# Patient Record
Sex: Female | Born: 1950 | Race: White | Hispanic: No | Marital: Single | Smoking: Never smoker
Health system: Southern US, Community
[De-identification: ages and names within clinical notes are randomized; demographics above are authoritative.]

---

## 1998-11-25 ENCOUNTER — Other Ambulatory Visit: Admission: RE | Admit: 1998-11-25 | Discharge: 1998-11-25 | Payer: Self-pay | Admitting: Gynecology

## 1999-11-26 ENCOUNTER — Other Ambulatory Visit: Admission: RE | Admit: 1999-11-26 | Discharge: 1999-11-26 | Payer: Self-pay | Admitting: Gynecology

## 2000-03-09 ENCOUNTER — Encounter: Admission: RE | Admit: 2000-03-09 | Discharge: 2000-03-09 | Payer: Self-pay | Admitting: Gynecology

## 2000-03-09 ENCOUNTER — Encounter: Payer: Self-pay | Admitting: Gynecology

## 2001-06-06 ENCOUNTER — Encounter: Admission: RE | Admit: 2001-06-06 | Discharge: 2001-06-06 | Payer: Self-pay | Admitting: Family Medicine

## 2001-06-06 ENCOUNTER — Encounter: Payer: Self-pay | Admitting: Family Medicine

## 2003-05-23 ENCOUNTER — Encounter: Admission: RE | Admit: 2003-05-23 | Discharge: 2003-06-22 | Payer: Self-pay | Admitting: Orthopedic Surgery

## 2003-08-02 ENCOUNTER — Encounter: Admission: RE | Admit: 2003-08-02 | Discharge: 2003-08-02 | Payer: Self-pay | Admitting: Family Medicine

## 2006-06-22 ENCOUNTER — Ambulatory Visit: Payer: Self-pay | Admitting: Gastroenterology

## 2006-06-28 ENCOUNTER — Ambulatory Visit: Payer: Self-pay | Admitting: Gastroenterology

## 2006-07-05 ENCOUNTER — Ambulatory Visit: Payer: Self-pay | Admitting: Gastroenterology

## 2006-07-13 ENCOUNTER — Ambulatory Visit: Payer: Self-pay | Admitting: Gastroenterology

## 2008-05-16 ENCOUNTER — Encounter: Admission: RE | Admit: 2008-05-16 | Discharge: 2008-05-16 | Payer: Self-pay | Admitting: Obstetrics and Gynecology

## 2008-07-17 ENCOUNTER — Other Ambulatory Visit: Admission: RE | Admit: 2008-07-17 | Discharge: 2008-07-17 | Payer: Self-pay | Admitting: Obstetrics and Gynecology

## 2008-07-17 ENCOUNTER — Ambulatory Visit: Payer: Self-pay | Admitting: Obstetrics and Gynecology

## 2008-07-17 ENCOUNTER — Encounter: Payer: Self-pay | Admitting: Obstetrics and Gynecology

## 2010-12-19 NOTE — Assessment & Plan Note (Signed)
[facility]                         GASTROENTEROLOGY OFFICE NOTE   DEBBI, STRANDBERG                 MRN:          [id number]  DATE:07/13/2006                            DOB:          26-Sep-1950    Katrina Avila is doing well since her endoscopy and dilatation on June 28, 2006.  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  She continues to complain of some epigastric  pain radiating to her left upper quadrant probably from acid reflux.   She did present because of elevated alkaline phosphatase.  Gamma GG was  normal, suggesting this is not hepatic in nature.  What I did uncover is  that she is a heavy user of alcohol with c-carbide deficient transferrin  of 3.0, normal less than 2.6.  This is very specific for heavy alcohol  use showing that she drinks more than 95% of patients.  I gave her a  CAGE test today and she has evidence of alcohol abuse, but I do not  think she has alcohol dependency and is not an alcoholic.  She also had  ultrasound of her upper abdomen on July 07, 2006 which was  unremarkable, without evidence of hepatic enlargement or fatty  infiltration of her liver.  She is on Synthroid 125 mcg a day, Lexapro  10 mg a day, and Aleve for degenerative arthritis.   Exam today shows her to weigh 188 pounds, her blood pressure 122/63, her  pulse was 64 and regular.  General physical examination was not  repeated.   RECOMMENDATIONS:  1. I have advised her about a controlled drinking program with      abstinence from alcohol if at all possible.  She obviously is a      heavy abuser of alcohol.  I reviewed her lab data with her at this      time.  2. Reflux regime.  She used to be on daily PPI therapy.  3. Reflux movie concerning reflux management.  4. P.r.n. NSAIDs are okay as long she is on PPI therapy.  5. I  suggested to her that she use Caltrate 600 with vitamin D twice a      day and consider a repeat bone density scanning for Dr. Wende Bushy      since it appears that her elevated alkaline phosphatase is from a      bony source and not from her liver.     Katrina Avila. Jarold Motto, MD, Caleen Essex, FAGA  Electronically Signed    DRP/MedQ  DD: 07/13/2006  DT: 07/13/2006  Job #: [id number]   cc:   Dr. Brandt Loosen

## 2010-12-19 NOTE — Assessment & Plan Note (Signed)
[facility]                           GASTROENTEROLOGY OFFICE NOTE   KEEANNA, VILLAFRANCA                 MRN:          [id number]  DATE:06/22/2006                            DOB:          [birth year]    Katrina Avila is a 60 year old white female referred by Dr. Wende Bushy for possible  screening colonoscopy for a history of intermittent bright red blood per  rectum.   Dabria has had periodic constipation with staining and has noticed bright red  blood during these times over the last year.  She denies abdominal pain and  any upper GI complaints, except for some periodic acid reflux symptoms, and  she recently has noticed some intermittent solid food dysphagia.  She was on  Nexium, but this caused her to be more constipated.  She currently is using  p.r.n. antacids.  She has never had endoscopy or colonoscopy exams.  She  denies any history or hepatitis or known liver disease, but has recently had  some liver function tests that showed a mildly elevated alkaline  phosphatase.  It is of note that the patient uses ethanol rather heavily,  and has concerned herself about her use.  She has never had known hepatitis  or pancreatitis from alcohol use.  She follows a fairly regular diet and  denies any specific food intolerances.   PAST MEDICAL HISTORY:  1. Remarkable for arthritis in her hips and back.  2. Chronic anxiety and depression.  3. Chronic thyroid dysfunction.   She never had abdominal surgery.   FAMILY HISTORY:  Remarkable for atherosclerosis, hypertension and diabetes.  There is no known gastrointestinal colon cancer that she is aware of, but  cancer does run in her family.   SOCIAL HISTORY:  The patient is single and lives alone.  She is a Associate Professor.  She does not smoke, but uses ethanol rather heavily.  She  denies social consequences from her drinking.   MEDICATIONS:  1. Synthroid 0.125 mg a day.  2. Lexapro 10 mg 1-2 times a  week.  3. She uses Aleve twice weekly.   ALLERGIES:  She denies drug allergies.   REVIEW OF SYSTEMS:  Remarkable for frequent urination and some urinary  incontinency.  She does complain of some excessive thirst, chronic low back  pain and recurrent skin rashes.  Review of systems otherwise noncontributory  without current cardiovascular or pulmonary complaints.   PHYSICAL EXAMINATION:  VITAL SIGNS:  Height 5 feet, 1 inch tall.  Weight is  189 pounds.  Blood pressure is 136/92, pulse 80 and regular.  I could not  appreciate stigmata of chronic liver disease or thyromegaly.  CHEST:  Generally clear.  CARDIAC:  She appeared to be in a regular rhythm without significant  murmurs, gallops, or rubs.  ABDOMEN:  There was no hepatosplenomegaly, abdominal masses or tenderness.  EXTREMITIES:  Unremarkable.  MENTAL STATUS:  Clear.  RECTAL:  No eternal hemorrhoids, fistulas or fistulae.   ASSESSMENT:  1. Intermittent rectal bleeding - rule out colonic polyposis versus      internal hemorrhoids.  2. Acid reflux with solid food dysphagia and probable peptic  stricture of      the esophagus.  3. Elevated alkaline phosphatase with normal transaminases possibly      secondary to alcohol abuse.  The patient probably does have fatty      infiltration of her liver associated with her obesity.  4. History of arthritis of her hips and back.  5. History of chronic thyroid dysfunction.  6. History of chronic anxiety and depression with intermittent Lexapro      use.   RECOMMENDATIONS:  1. Outpatient endoscopy and colonoscopy at her convenience.  2. Check upper abdominal ultrasound exam.  3. Check GGT and CGT (carbohydrate deficient transferrin exams).  4. Continue other medications as per Dr. Wende Bushy.     Vania Rea. Jarold Motto, MD, Caleen Essex, FAGA  Electronically Signed    DRP/MedQ  DD: 06/22/2006  DT: 06/22/2006  Job #: [id number]   cc:   Dr. Wende Bushy

## 2011-01-01 ENCOUNTER — Encounter: Payer: Self-pay | Admitting: Obstetrics and Gynecology

## 2016-07-10 ENCOUNTER — Other Ambulatory Visit: Payer: Self-pay | Admitting: Internal Medicine

## 2016-07-10 DIAGNOSIS — N644 Mastodynia: Secondary | ICD-10-CM

## 2016-07-14 ENCOUNTER — Other Ambulatory Visit: Payer: Self-pay | Admitting: Gynecology

## 2016-07-14 DIAGNOSIS — N644 Mastodynia: Secondary | ICD-10-CM

## 2016-07-21 ENCOUNTER — Ambulatory Visit
Admission: RE | Admit: 2016-07-21 | Discharge: 2016-07-21 | Disposition: A | Discharge status: Home / Self Care | Payer: Medicare Other | Source: Ambulatory Visit | Attending: Internal Medicine | Admitting: Internal Medicine

## 2016-07-21 DIAGNOSIS — N644 Mastodynia: Secondary | ICD-10-CM

## 2018-03-08 ENCOUNTER — Other Ambulatory Visit: Payer: Self-pay | Admitting: Family Medicine

## 2018-03-08 DIAGNOSIS — R5381 Other malaise: Secondary | ICD-10-CM

## 2018-03-10 ENCOUNTER — Other Ambulatory Visit: Payer: Self-pay | Admitting: Family Medicine

## 2018-03-10 DIAGNOSIS — Z1231 Encounter for screening mammogram for malignant neoplasm of breast: Secondary | ICD-10-CM

## 2018-03-10 DIAGNOSIS — E2839 Other primary ovarian failure: Secondary | ICD-10-CM

## 2018-04-25 ENCOUNTER — Ambulatory Visit
Admission: RE | Admit: 2018-04-25 | Discharge: 2018-04-25 | Disposition: A | Discharge status: Home / Self Care | Payer: Medicare Other | Source: Ambulatory Visit | Attending: Family Medicine | Admitting: Family Medicine

## 2018-04-25 DIAGNOSIS — E2839 Other primary ovarian failure: Secondary | ICD-10-CM

## 2018-04-25 DIAGNOSIS — Z1231 Encounter for screening mammogram for malignant neoplasm of breast: Secondary | ICD-10-CM

## 2019-10-10 IMAGING — MG DIGITAL SCREENING BILATERAL MAMMOGRAM WITH TOMO AND CAD
8 series · 8 of 24 positions shown · non-contrast
Comparison: Previous exam(s).

CLINICAL DATA: Screening.

EXAM:
DIGITAL SCREENING BILATERAL MAMMOGRAM WITH TOMO AND CAD

[R CC synth-2D]
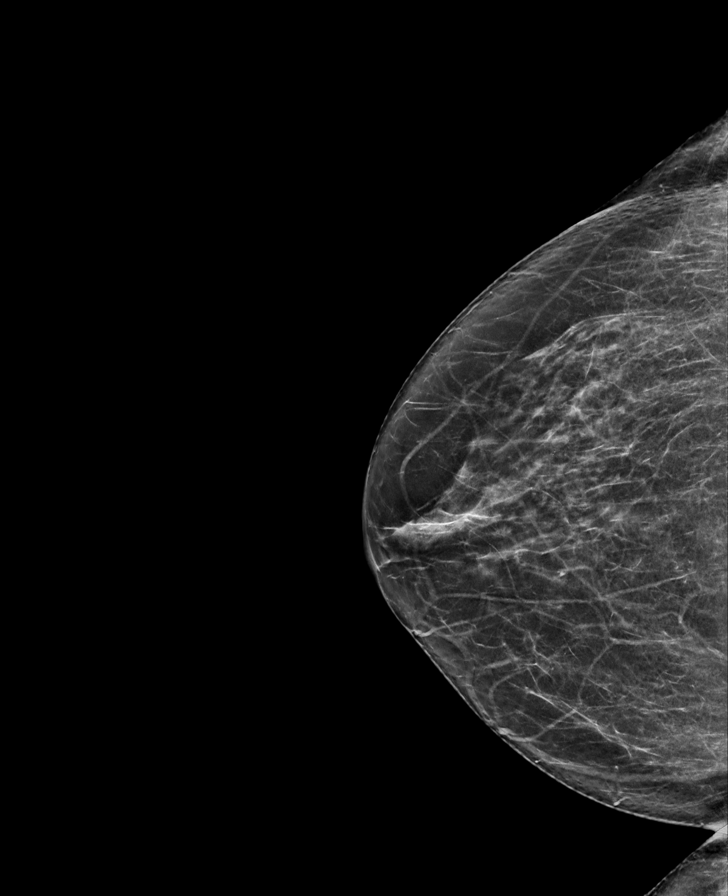

[R MLO synth-2D]
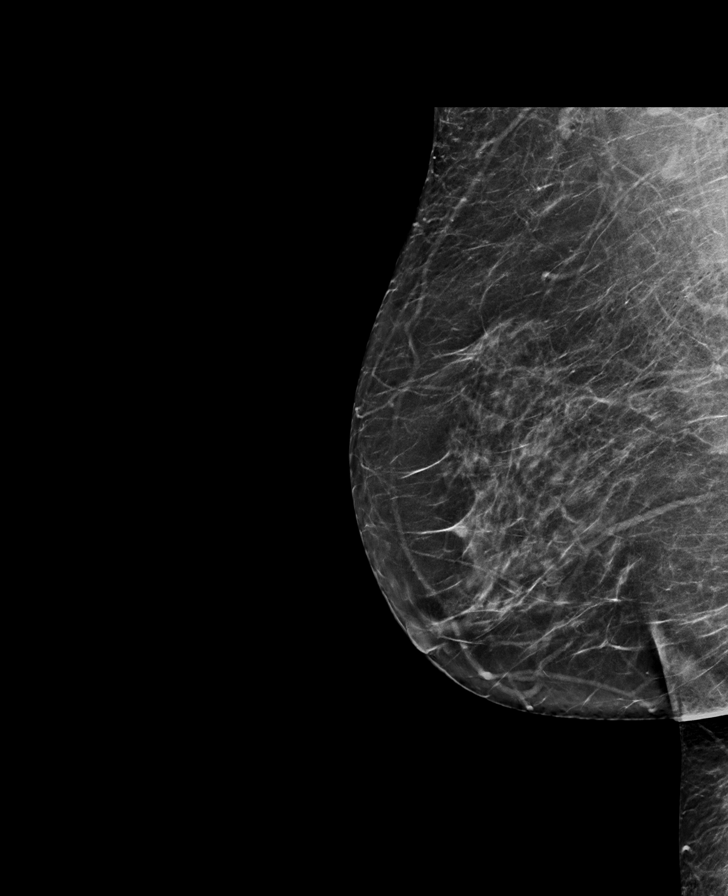

[L CC synth-2D]
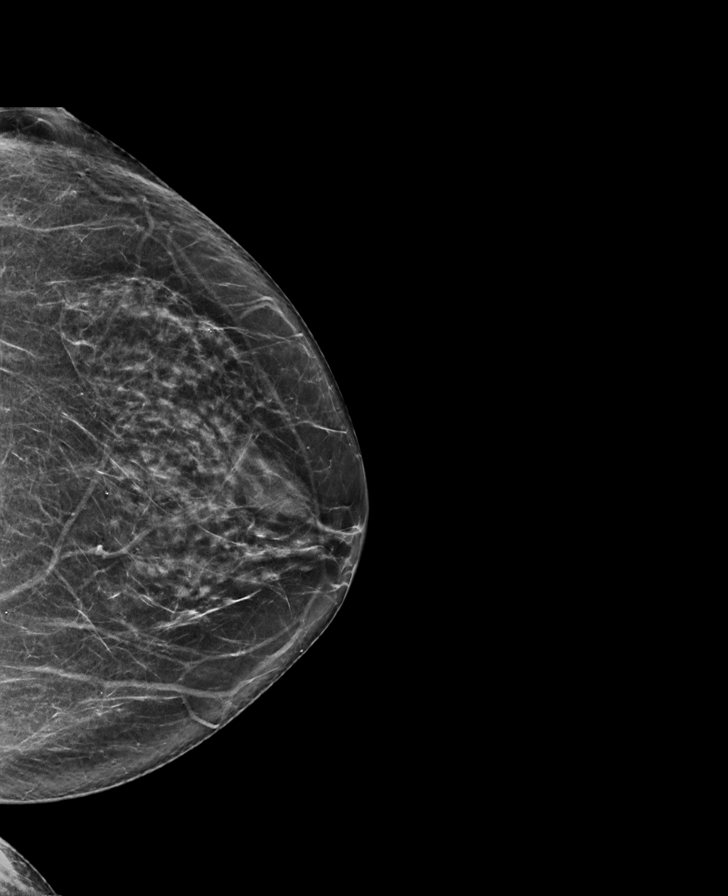

[L MLO synth-2D]
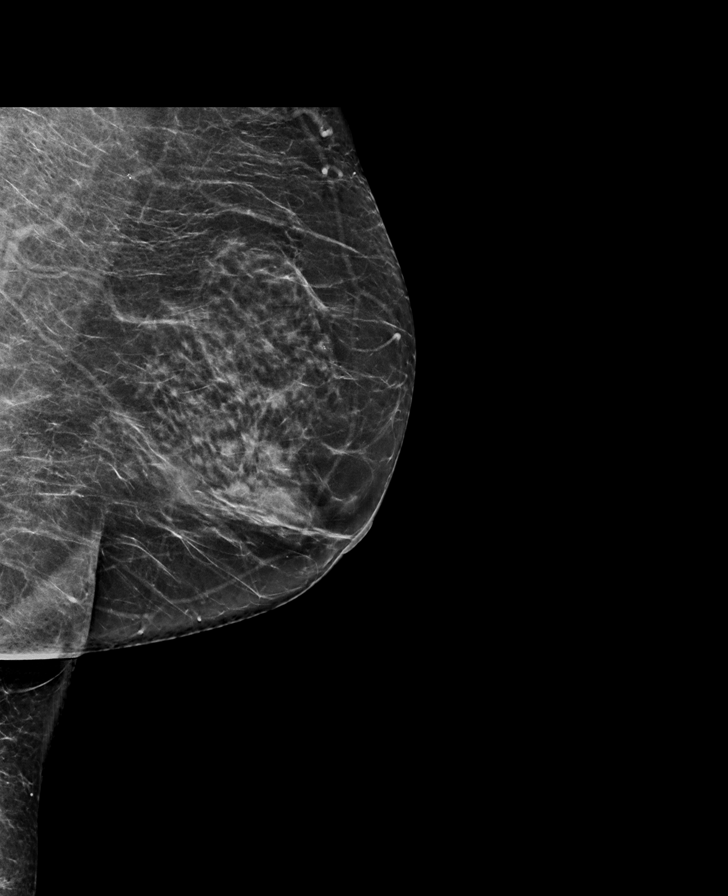

[L MLO tomo · tomo slice 37/72.0]
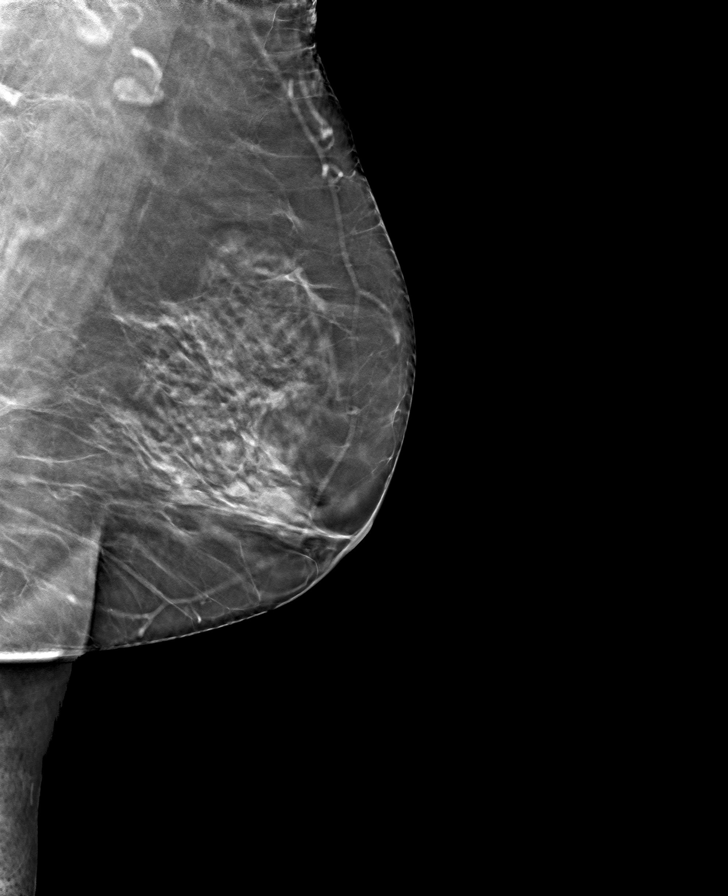

[L CC tomo · tomo slice 35/70.0]
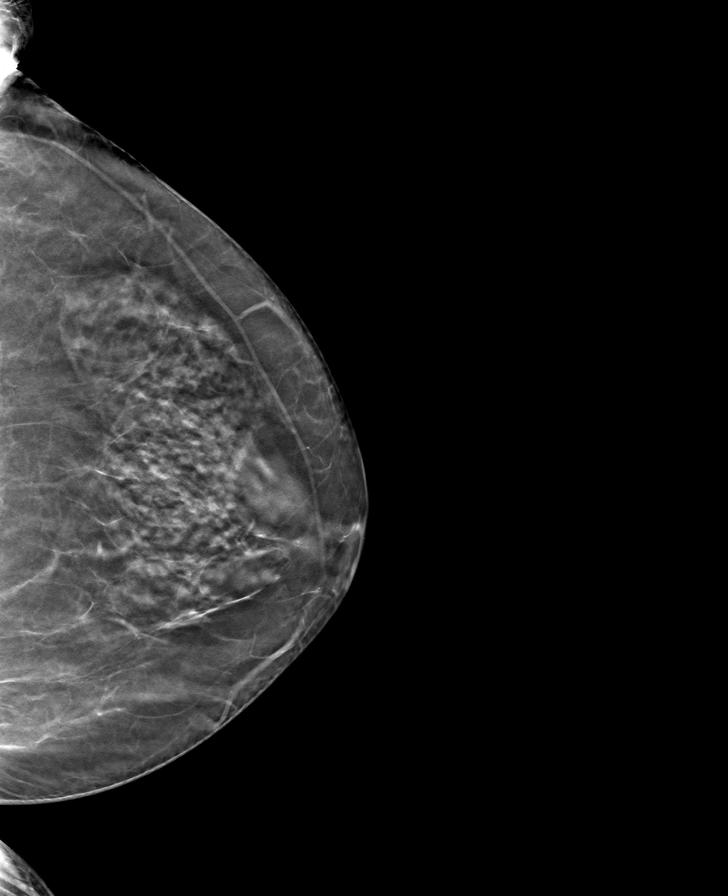

[R MLO tomo · tomo slice 37/72.0]
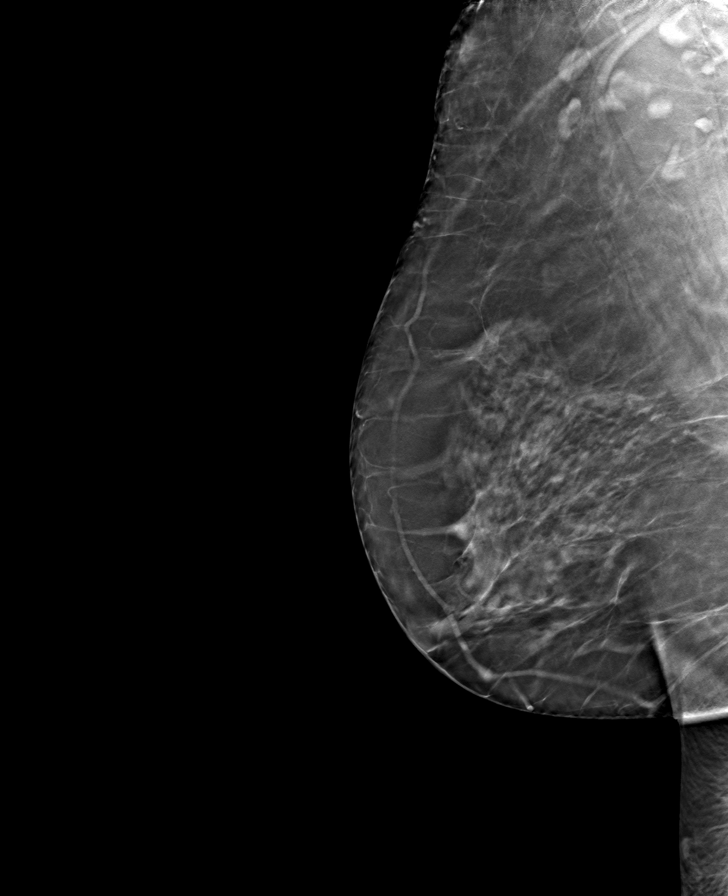

[R CC tomo · tomo slice 35/68.0]
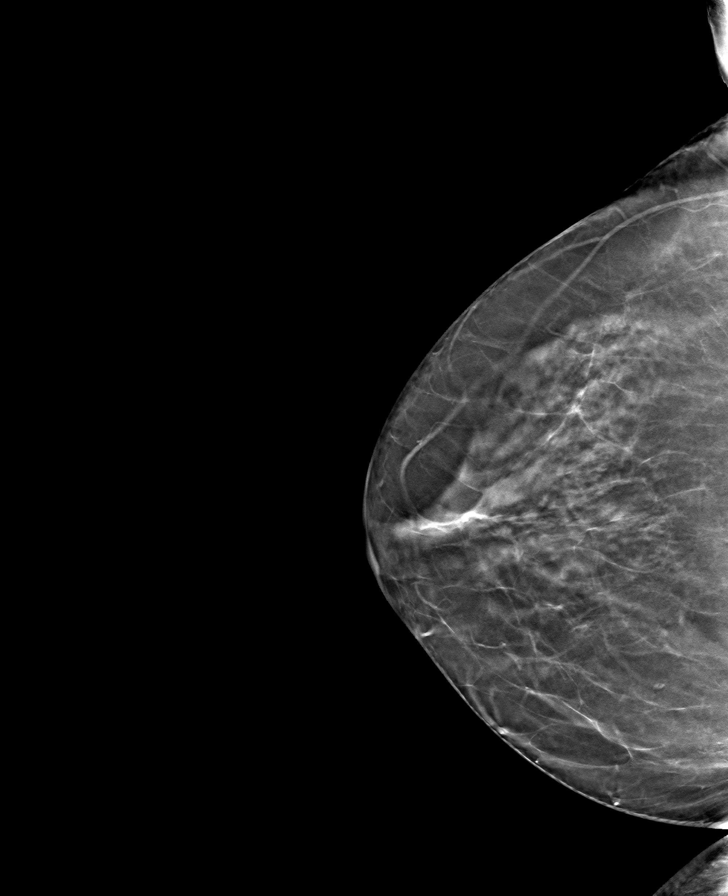

[8 of 24 positions shown; findings below may reference images not displayed]

ACR Breast Density Category b: There are scattered areas of
fibroglandular density.
FINDINGS: There are no findings suspicious for malignancy. Images were
processed with CAD.
IMPRESSION: No mammographic evidence of malignancy. A result letter of this
screening mammogram will be mailed directly to the patient.

RECOMMENDATION:
Screening mammogram in one year. (Code:CN-U-775)

BI-RADS CATEGORY  1: Negative.

## 2020-05-29 ENCOUNTER — Other Ambulatory Visit: Payer: Self-pay | Admitting: Family Medicine

## 2020-05-29 DIAGNOSIS — Z1231 Encounter for screening mammogram for malignant neoplasm of breast: Secondary | ICD-10-CM

## 2020-05-30 ENCOUNTER — Ambulatory Visit
Admission: RE | Admit: 2020-05-30 | Discharge: 2020-05-30 | Disposition: A | Discharge status: Home / Self Care | Payer: Medicare Other | Source: Ambulatory Visit | Attending: Family Medicine | Admitting: Family Medicine

## 2020-05-30 ENCOUNTER — Other Ambulatory Visit: Payer: Self-pay

## 2020-05-30 ENCOUNTER — Ambulatory Visit: Payer: Medicare Other

## 2020-05-30 DIAGNOSIS — Z1231 Encounter for screening mammogram for malignant neoplasm of breast: Secondary | ICD-10-CM

## 2022-06-12 ENCOUNTER — Encounter: Payer: Self-pay | Admitting: Internal Medicine

## 2022-06-12 ENCOUNTER — Ambulatory Visit: Payer: Medicare Other | Attending: Internal Medicine | Admitting: Internal Medicine

## 2022-06-12 VITALS — BP 132/80 | HR 61 | Ht 59.5 in | Wt 205.0 lb

## 2022-06-12 DIAGNOSIS — I517 Cardiomegaly: Secondary | ICD-10-CM

## 2022-06-12 DIAGNOSIS — E782 Mixed hyperlipidemia: Secondary | ICD-10-CM | POA: Diagnosis not present

## 2022-06-12 DIAGNOSIS — R072 Precordial pain: Secondary | ICD-10-CM | POA: Diagnosis not present

## 2022-06-12 DIAGNOSIS — R55 Syncope and collapse: Secondary | ICD-10-CM

## 2022-06-12 DIAGNOSIS — R0602 Shortness of breath: Secondary | ICD-10-CM

## 2022-06-12 NOTE — Progress Notes (Signed)
Cardiology Office Note:    Date:  06/12/2022   ID:  RIVERS GASSMANN, DOB 1951/06/06, MRN [id number]  PCP:  Gillian Scarce, MD (Inactive)   [facility] Providers Cardiologist:  Christell Constant, MD     Referring MD: Deatra James, MD   CC: SOB Consulted for the evaluation of at the behest of Dr. Sheppard Plumber  History of Present Illness:    Katrina Avila is a 71 y.o. female with a hx of fatigue and SOB.  Morbid obesity.  HLD and hypothyroidism.  In October she was having dizziness, fatigue, and tinnitus  Was last feeling well poorly all of October.  It came and went and got worse.  Got worse after his clinic.  Went to a walk in clinic because her was told on Halloween she had a heart attach in October.  Had chest pain that go to her back.  Every once in while.  Not clearly positional.  No clear exertional component.  Sharp pain.  Able to clean without issue and move furniture.  No shortness of breath.  Still feels fatigue.  Has DOE with going up stairs.  No PND or orthopnea.  No acute weight gain, leg swelling , or abdominal swelling.  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 Notes no palpitations or funny heart beats.     No prior cardiac testing  No past medical history on file.  No past surgical history on file.  Current Medications: Current Meds  Medication Sig   Cholecalciferol 50 MCG (2000 UT) CAPS Take 5,000 Units by mouth daily at 6 (six) AM.   hydrocortisone cream 1 % Apply topically as needed (roscea).   levothyroxine (SYNTHROID) 100 MCG tablet Take 100 mcg by mouth daily.   losartan-hydrochlorothiazide (HYZAAR) 50-12.5 MG tablet daily.   neomycin-polymyxin b-dexamethasone (MAXITROL) 3.5-10000-0.1 SUSP daily at 6 (six) AM.   omeprazole (PRILOSEC) 20 MG capsule Take 20 mg by mouth daily.   sertraline (ZOLOFT) 50 MG tablet Take by mouth 3 (three) times a week.      Allergies:   Ace inhibitors and Honey bee venom [bee venom]   Social History   Socioeconomic History   Marital status: Single    Spouse name: Not on file   Number of children: Not on file   Years of education: Not on file   Highest education level: Not on file  Occupational History   Not on file  Tobacco Use   Smoking status: Never   Smokeless tobacco: Never  Substance and Sexual Activity   Alcohol use: Yes    Alcohol/week: 4.0 standard drinks of alcohol    Types: 4 Glasses of wine per week    Comment: weekly   Drug use: Never   Sexual activity: Not Currently  Other Topics Concern   Not on file  Social History Narrative   Not on file   Social Determinants of Health   Financial Resource Strain: Not on file  Food Insecurity: Not on file  Transportation Needs: Not on file  Physical Activity: Not on file  Stress: Not on file  Social Connections: Not on file    Family History: The patient's family history includes Breast cancer in her cousin, cousin, maternal aunt, and maternal aunt. Father had MI Mother had CHF at 27  ROS:   Please see the history of present illness.     All other systems reviewed and  are negative.  EKGs/Labs/Other Studies Reviewed:    The following studies were reviewed today:  EKG:  EKG is  ordered today.  The ekg ordered today demonstrates  SR 61 LVH and Bi-atrial enlargement small septal Qs 06/03/31 SR LVH Bi-atrial enlargement with small septal Qs  Recent Labs: No results found for requested labs within last 365 days.  Recent Lipid Panel No results found for: "CHOL", "TRIG", "HDL", "CHOLHDL", "VLDL", "LDLCALC", "LDLDIRECT"   Physical Exam:    VS:  BP 132/80   Pulse 61   Ht 4' 11.5" (1.511 m)   Wt 205 lb (93 kg)   SpO2 97%   BMI 40.71 kg/m     Wt Readings from Last 3 Encounters:  06/12/22 205 lb (93 kg)    GEN: no distress morbid obesity HEENT: Normal NECK: No JVD LYMPHATICS: No lymphadenopathy CARDIAC: RRR, systolic  murmur, no rubs, gallops RESPIRATORY:  Clear to auscultation without rales, wheezing or rhonchi  ABDOMEN: Soft, non-tender, non-distended MUSCULOSKELETAL:  No edema; No deformity  SKIN: Warm and dry NEUROLOGIC:  Alert and oriented x 3 PSYCHIATRIC:  Normal affect   ASSESSMENT:    1. Precordial pain   2. SOB (shortness of breath)   3. LVH (left ventricular hypertrophy)   4. Mixed hyperlipidemia   5. Morbid obesity (HCC)   6. Vasovagal syncope    PLAN:    Chest pain - will clarify her diagnosis of prior MI, PET MPI; she cannot exercise on treadmill  LVH Systolic murmur Biatrial enlargement - suspect AS; will get echo  HLD Morbid obesity - Discuss lifestyle changes  Hx of vasovagal syncope - no sx, monitor   Medication Adjustments/Labs and Tests Ordered: Current medicines are reviewed at length with the patient today.  Concerns regarding medicines are outlined above.  Orders Placed This Encounter  Procedures   NM PET CT CARDIAC PERFUSION MULTI W/ABSOLUTE BLOODFLOW   EKG 12-Lead   ECHOCARDIOGRAM COMPLETE   No orders of the defined types were placed in this encounter.   Patient Instructions  Medication Instructions:  Your physician recommends that you continue on your current medications as directed. Please refer to the Current Medication list given to you today.  *If you need a refill on your cardiac medications before your next appointment, please call your pharmacy*   Lab Work: NONE If you have labs (blood work) drawn today and your tests are completely normal, you will receive your results only by: MyChart Message (if you have MyChart) OR A paper copy in the mail If you have any lab test that is abnormal or we need to change your treatment, we will call you to review the results.   Testing/Procedures: Your physician has requested that you have an echocardiogram. Echocardiography is a painless test that uses sound waves to create images of your heart. It  provides your doctor with information about the size and shape of your heart and how well your heart's chambers and valves are working. This procedure takes approximately one hour. There are no restrictions for this procedure. Please do NOT wear cologne, perfume, aftershave, or lotions (deodorant is allowed). Please arrive 15 minutes prior to your appointment time.    Follow-Up: At [facility], you and your health needs are our priority.  As part of our continuing mission to provide you with exceptional heart care, we have created designated Provider Care Teams.  These Care Teams include your primary Cardiologist (physician) and Advanced Practice Providers (APPs -  Physician Assistants and Nurse  Practitioners) who all work together to provide you with the care you need, when you need it.  We recommend signing up for the patient portal called "MyChart".  Sign up information is provided on this After Visit Summary.  MyChart is used to connect with patients for Virtual Visits (Telemedicine).  Patients are able to view lab/test results, encounter notes, upcoming appointments, etc.  Non-urgent messages can be sent to your provider as well.   To learn more about what you can do with MyChart, go to [email or web address].    Your next appointment:   3-4  month(s)  The format for your next appointment:   In Person  Provider:  Riley Lam, MD     Other Instructions How to Prepare for Your Cardiac PET/CT Stress Test:  1. Please do not take these medications before your test:   Medications that may interfere with the cardiac pharmacological stress agent (ex. nitrates - including erectile dysfunction medications or beta-blockers) the day of the exam. (Erectile dysfunction medication should be held for at least 72 hrs prior to test) Theophylline containing medications for 12 hours. Dipyridamole 48 hours prior to the test. Your remaining medications may be taken with water.  2.  Nothing to eat or drink, except water, 3 hours prior to arrival time.   NO caffeine/decaffeinated products, or chocolate 12 hours prior to arrival.  3. NO perfume, cologne or lotion  4. Total time is 1 to 2 hours; you may want to bring reading material for the waiting time.  5. Please report to Admitting at the [facility] Main Entrance 60 minutes early for your test.  [address] [place], [place] [address]  Diabetic Preparation:  Hold oral medications. You may take NPH and Lantus insulin. Do not take Humalog or Humulin R (Regular Insulin) the day of your test. Check blood sugars prior to leaving the house. If able to eat breakfast prior to 3 hour fasting, you may take all medications, including your insulin, Do not worry if you miss your breakfast dose of insulin - start at your next meal.   In preparation for your appointment, medication and supplies will be purchased.  Appointment availability is limited, so if you need to cancel or reschedule, please call the Radiology Department at [phone]  24 hours in advance to avoid a cancellation fee of $100.00  What to Expect After you Arrive:  Once you arrive and check in for your appointment, you will be taken to a preparation room within the Radiology Department.  A technologist or Nurse will obtain your medical history, verify that you are correctly prepped for the exam, and explain the procedure.  Afterwards,  an IV will be started in your arm and electrodes will be placed on your skin for EKG monitoring during the stress portion of the exam. Then you will be escorted to the PET/CT scanner.  There, staff will get you positioned on the scanner and obtain a blood pressure and EKG.  During the exam, you will continue to be connected to the EKG and blood pressure machines.  A small, safe amount of a radioactive tracer will be injected in your IV to obtain a series of pictures of your heart along with an injection of a stress  agent.    After your Exam:  It is recommended that you eat a meal and drink a caffeinated beverage to counter act any effects of the stress agent.  Drink plenty of fluids for the remainder of the  day and urinate frequently for the first couple of hours after the exam.  Your doctor will inform you of your test results within 7-10 business days.  For questions about your test or how to prepare for your test, please call: Rockwell Alexandria, Cardiac Imaging Nurse Navigator  Larey Brick, Cardiac Imaging Nurse Navigator Office: [phone]   Important Information About Sugar         Signed, Christell Constant, MD  06/12/2022 5:43 PM    [facility]

## 2022-06-12 NOTE — Patient Instructions (Addendum)
Medication Instructions:  Your physician recommends that you continue on your current medications as directed. Please refer to the Current Medication list given to you today.  *If you need a refill on your cardiac medications before your next appointment, please call your pharmacy*   Lab Work: NONE If you have labs (blood work) drawn today and your tests are completely normal, you will receive your results only by: MyChart Message (if you have MyChart) OR A paper copy in the mail If you have any lab test that is abnormal or we need to change your treatment, we will call you to review the results.   Testing/Procedures: Your physician has requested that you have an echocardiogram. Echocardiography is a painless test that uses sound waves to create images of your heart. It provides your doctor with information about the size and shape of your heart and how well your heart's chambers and valves are working. This procedure takes approximately one hour. There are no restrictions for this procedure. Please do NOT wear cologne, perfume, aftershave, or lotions (deodorant is allowed). Please arrive 15 minutes prior to your appointment time.    Follow-Up: At [facility], you and your health needs are our priority.  As part of our continuing mission to provide you with exceptional heart care, we have created designated Provider Care Teams.  These Care Teams include your primary Cardiologist (physician) and Advanced Practice Providers (APPs -  Physician Assistants and Nurse Practitioners) who all work together to provide you with the care you need, when you need it.  We recommend signing up for the patient portal called "MyChart".  Sign up information is provided on this After Visit Summary.  MyChart is used to connect with patients for Virtual Visits (Telemedicine).  Patients are able to view lab/test results, encounter notes, upcoming appointments, etc.  Non-urgent messages can be sent to your  provider as well.   To learn more about what you can do with MyChart, go to [email or web address].    Your next appointment:   3-4  month(s)  The format for your next appointment:   In Person  Provider:  Riley Lam, MD     Other Instructions How to Prepare for Your Cardiac PET/CT Stress Test:  1. Please do not take these medications before your test:   Medications that may interfere with the cardiac pharmacological stress agent (ex. nitrates - including erectile dysfunction medications or beta-blockers) the day of the exam. (Erectile dysfunction medication should be held for at least 72 hrs prior to test) Theophylline containing medications for 12 hours. Dipyridamole 48 hours prior to the test. Your remaining medications may be taken with water.  2. Nothing to eat or drink, except water, 3 hours prior to arrival time.   NO caffeine/decaffeinated products, or chocolate 12 hours prior to arrival.  3. NO perfume, cologne or lotion  4. Total time is 1 to 2 hours; you may want to bring reading material for the waiting time.  5. Please report to Admitting at the [facility] Main Entrance 60 minutes early for your test.  [address] [place], [place] [address]  Diabetic Preparation:  Hold oral medications. You may take NPH and Lantus insulin. Do not take Humalog or Humulin R (Regular Insulin) the day of your test. Check blood sugars prior to leaving the house. If able to eat breakfast prior to 3 hour fasting, you may take all medications, including your insulin, Do not worry if you miss your breakfast dose  of insulin - start at your next meal.   In preparation for your appointment, medication and supplies will be purchased.  Appointment availability is limited, so if you need to cancel or reschedule, please call the Radiology Department at [phone]  24 hours in advance to avoid a cancellation fee of $100.00  What to Expect After you Arrive:  Once  you arrive and check in for your appointment, you will be taken to a preparation room within the Radiology Department.  A technologist or Nurse will obtain your medical history, verify that you are correctly prepped for the exam, and explain the procedure.  Afterwards,  an IV will be started in your arm and electrodes will be placed on your skin for EKG monitoring during the stress portion of the exam. Then you will be escorted to the PET/CT scanner.  There, staff will get you positioned on the scanner and obtain a blood pressure and EKG.  During the exam, you will continue to be connected to the EKG and blood pressure machines.  A small, safe amount of a radioactive tracer will be injected in your IV to obtain a series of pictures of your heart along with an injection of a stress agent.    After your Exam:  It is recommended that you eat a meal and drink a caffeinated beverage to counter act any effects of the stress agent.  Drink plenty of fluids for the remainder of the day and urinate frequently for the first couple of hours after the exam.  Your doctor will inform you of your test results within 7-10 business days.  For questions about your test or how to prepare for your test, please call: Rockwell Alexandria, Cardiac Imaging Nurse Navigator  Larey Brick, Cardiac Imaging Nurse Navigator Office: [phone]   Important Information About Sugar

## 2022-07-13 ENCOUNTER — Ambulatory Visit (HOSPITAL_COMMUNITY): Payer: Medicare Other | Attending: Cardiovascular Disease

## 2022-07-13 DIAGNOSIS — R0602 Shortness of breath: Secondary | ICD-10-CM | POA: Insufficient documentation

## 2022-07-13 DIAGNOSIS — R072 Precordial pain: Secondary | ICD-10-CM | POA: Diagnosis not present

## 2022-07-13 DIAGNOSIS — I080 Rheumatic disorders of both mitral and aortic valves: Secondary | ICD-10-CM | POA: Diagnosis not present

## 2022-07-13 DIAGNOSIS — E785 Hyperlipidemia, unspecified: Secondary | ICD-10-CM | POA: Diagnosis not present

## 2022-07-13 LAB — ECHOCARDIOGRAM COMPLETE
AR max vel: 1.45 cm2
AV Area VTI: 1.53 cm2
AV Area mean vel: 1.54 cm2
AV Mean grad: 9.5 mmHg
AV Peak grad: 19.3 mmHg
Ao pk vel: 2.2 m/s
Area-P 1/2: 3.19 cm2
P 1/2 time: 462 msec
S' Lateral: 2.3 cm

## 2022-07-17 ENCOUNTER — Telehealth: Payer: Self-pay | Admitting: Internal Medicine

## 2022-07-17 NOTE — Telephone Encounter (Signed)
Patient is calling to get her echo results. Please call back

## 2022-07-17 NOTE — Telephone Encounter (Signed)
Patient was asking for her Echo results. I let her know they weren't available yet.

## 2022-07-21 NOTE — Telephone Encounter (Signed)
Talked with pt in regards to having PET Scan performed.  Pt reports was told had a prior Heart Attack and is unsure if this is correct.  Advised pt that per MD OV note has a hx of prior MI.  Advised pt that having PET Scan will give more information as to if actually had a prior MI.    Pt will have PET Scan on 07/28/22 and will call back or send in a my chart message if decides would like to speak with MD about dx of MI.

## 2022-07-21 NOTE — Telephone Encounter (Signed)
Left a message to call back.

## 2022-07-24 ENCOUNTER — Telehealth (HOSPITAL_COMMUNITY): Payer: Self-pay | Admitting: Emergency Medicine

## 2022-07-24 NOTE — Telephone Encounter (Signed)
Reaching out to patient to offer assistance regarding upcoming cardiac imaging study; pt verbalizes understanding of appt date/time, parking situation and where to check in, pre-test NPO status and medications ordered, and verified current allergies; name and call back number provided for further questions should they arise Rockwell Alexandria RN Navigator Cardiac Imaging [facility] [phone] office [phone] cell  Arrival 1200 Daily meds  No food No caffeine

## 2022-07-28 ENCOUNTER — Ambulatory Visit (HOSPITAL_COMMUNITY)
Admission: RE | Admit: 2022-07-28 | Discharge: 2022-07-28 | Disposition: A | Discharge status: Home / Self Care | Payer: Medicare Other | Source: Ambulatory Visit | Attending: Internal Medicine | Admitting: Internal Medicine

## 2022-07-28 DIAGNOSIS — R072 Precordial pain: Secondary | ICD-10-CM | POA: Insufficient documentation

## 2022-07-28 LAB — NM PET CT CARDIAC PERFUSION MULTI W/ABSOLUTE BLOODFLOW
Base ST Depression (mm): 0 mm
MBFR: 2.07
Nuc Rest EF: 70 %
Nuc Stress EF: 76 %
Rest MBF: 1.5 ml/g/min
ST Depression (mm): 0 mm
Stress MBF: 3.11 ml/g/min
TID: 1

## 2022-07-28 MED ORDER — RUBIDIUM RB82 GENERATOR (RUBYFILL)
24.0000 | PACK | Freq: Once | INTRAVENOUS | Status: AC
  Administered 2022-07-28: 24 via INTRAVENOUS

## 2022-07-28 MED ORDER — REGADENOSON 0.4 MG/5ML IV SOLN
0.4000 mg | Freq: Once | INTRAVENOUS | Status: AC
  Administered 2022-07-28: 0.4 mg via INTRAVENOUS

## 2022-07-31 ENCOUNTER — Encounter: Payer: Self-pay | Admitting: Internal Medicine

## 2022-07-31 NOTE — Telephone Encounter (Signed)
Called pt in regards to PET Scan results.  Pt is concerned about the following finding:  Coronary calcium assessment not performed due to evidence of metallic appearing structure in the LCx territory most likely consistent with prior PCI.  Pt reports came to Cardiologist to determine if had a prior MI d/t an abnormal EKG that show previous MI.  Wants to know if she has had an MI.    Asked if has ever had a Heart Cath or went to the hospital with CP.  Pt denies both.  Does not know how a stent could be in her heart has never had any type of work done on heart.    Pt is also concerned about lung findings.  Advised pt per MD- PET Scan did not show anything of concern.    Will route to MD to follow up.

## 2022-09-30 ENCOUNTER — Encounter: Payer: Self-pay | Admitting: Internal Medicine

## 2022-09-30 ENCOUNTER — Ambulatory Visit: Payer: Medicare Other | Attending: Internal Medicine | Admitting: Internal Medicine

## 2022-09-30 VITALS — BP 130/80 | HR 65 | Ht 60.0 in | Wt 210.0 lb

## 2022-09-30 DIAGNOSIS — E782 Mixed hyperlipidemia: Secondary | ICD-10-CM | POA: Diagnosis present

## 2022-09-30 DIAGNOSIS — I3481 Nonrheumatic mitral (valve) annulus calcification: Secondary | ICD-10-CM | POA: Diagnosis present

## 2022-09-30 NOTE — Progress Notes (Signed)
Cardiology Office Note:    Date:  09/30/2022   ID:  Katrina Avila, DOB Feb 19, 1951, MRN [id number]  PCP:  Jonathon Resides, MD (Inactive)   [facility] Providers Cardiologist:  Werner Lean, MD     Referring MD: No ref. provider found   CC: SOB  History of Present Illness:    Katrina Avila is a 72 y.o. female with a hx of fatigue and SOB.  Morbid obesity.  HLD and hypothyroidism. 2023: Negative PET but with Mitral annular Calcification.  No dysfunction on the echo.  Patient notes that she is doing well.   Since last visit notes no more chest pain. She is working on the the election (night shift of primary)  No chest pain or pressure .  No SOB/DOE and no PND/Orthopnea.  No weight gain or leg swelling.  No palpitations or syncope.  Has have fatigue with all of this.  No past medical history on file.  No past surgical history on file.  Current Medications: Current Meds  Medication Sig   Cholecalciferol 50 MCG (2000 UT) CAPS Take 5,000 Units by mouth daily at 6 (six) AM.   hydrocortisone cream 1 % Apply topically as needed (roscea).   levothyroxine (SYNTHROID) 100 MCG tablet Take 100 mcg by mouth daily.   losartan-hydrochlorothiazide (HYZAAR) 50-12.5 MG tablet daily.   neomycin-polymyxin b-dexamethasone (MAXITROL) 3.5-10000-0.1 SUSP daily at 6 (six) AM.   omeprazole (PRILOSEC) 20 MG capsule Take 20 mg by mouth daily.   prednisoLONE acetate (PRED FORTE) 1 % ophthalmic suspension Place into both eyes as directed.   sertraline (ZOLOFT) 50 MG tablet Take by mouth 3 (three) times a week.     Allergies:   Ace inhibitors and Honey bee venom [bee venom]   Social History   Socioeconomic History   Marital status: Single    Spouse name: Not on file   Number of children: Not on file   Years of education: Not on file   Highest education level: Not on file  Occupational History   Not on file  Tobacco Use   Smoking status: Never   Smokeless  tobacco: Never  Substance and Sexual Activity   Alcohol use: Yes    Alcohol/week: 4.0 standard drinks of alcohol    Types: 4 Glasses of wine per week    Comment: weekly   Drug use: Never   Sexual activity: Not Currently  Other Topics Concern   Not on file  Social History Narrative   Not on file   Social Determinants of Health   Financial Resource Strain: Not on file  Food Insecurity: Not on file  Transportation Needs: Not on file  Physical Activity: Not on file  Stress: Not on file  Social Connections: Not on file    Family History: The patient's family history includes Breast cancer in her cousin, cousin, maternal aunt, and maternal aunt. Father had MI Mother had CHF at 62  ROS:   Please see the history of present illness.     All other systems reviewed and are negative.  EKGs/Labs/Other Studies Reviewed:    The following studies were reviewed today:  EKG:   SR 61 LVH and Bi-atrial enlargement small septal Qs 06/03/31 SR LVH Bi-atrial enlargement with small septal Qs  Cardiac Studies & Procedures     STRESS TESTS  NM PET CT CARDIAC PERFUSION MULTI W/ABSOLUTE BLOODFLOW 07/28/2022  Narrative   The study is normal. The study is low risk.   LV perfusion  is normal.   Rest left ventricular function is normal. Rest EF: 70 %. Stress left ventricular function is normal. Stress EF: 76 %. End diastolic cavity size is normal. End systolic cavity size is normal.   Myocardial blood flow was computed to be 1.49m/g/min at rest and 3.166mg/min at stress. Global myocardial blood flow reserve was 2.07 and was normal.   Coronary calcium assessment not performed due to evidence of metallic appearing structure in the LCx territory most likely consistent with prior PCI.   Electronically signed: HeGwyndolyn KaufmanMD  EXAM: OVER-READ INTERPRETATION CARDIAC CT CHEST  The following report is an over-read performed by radiologist Dr. WaVan Clinesf [facility] [place]  07/28/2022. This over-read does not include interpretation of cardiac or coronary anatomy or pathology. The cardiac PET interpretation by the cardiologist is attached.  COMPARISON:  None.  FINDINGS: Extracardiac Vascular: Descending thoracic aortic atherosclerotic vascular calcification.  Mediastinum: Unremarkable  Lung: Mild dependent atelectasis in the lung bases, equivocal peripheral reticulonodular interstitial accentuation. No acute findings. The  Included Upper Abdomen: Unremarkable  Musculoskeletal: Mild rightward tilt of the sternal body.  IMPRESSION: 1. Mild rightward tilt of the sternal body. 2. Mild dependent atelectasis in the lung bases, with equivocal peripheral reticulonodular interstitial accentuation. This is nonspecific but can be seen in the setting of low-grade interstitial lung disease. 3. Descending thoracic aortic atherosclerotic vascular calcification.  Aortic Atherosclerosis (ICD10-I70.0).   Electronically Signed By: WaVan Clines.D. On: 07/28/2022 16:08   ECHOCARDIOGRAM  ECHOCARDIOGRAM COMPLETE 07/13/2022  Narrative ECHOCARDIOGRAM REPORT    [facility]:   [facility] 07/13/2022 Medical Rec #:  [id number]          Height:       59.5 in Accession #:    [id number]         Weight:       205.0 lb Date of Birth:  12Jun 28, 1952         BSA:          1.875 m Patient Age:    [age]             BP:           132/80 mmHg Patient Gender: F                    HR:           73 bpm. [place] [facility]  [facility] [facility]D Echo, Cardiac Doppler and Color Doppler  Indications:    R06.02 SOB  History:        Patient has no prior history of Echocardiogram examinations. Morbid obesity, Arrythmias:LVH, Signs/Symptoms:Shortness of Breath and Murmur; Risk Factors:Dyslipidemia.  Sonographer:    WiCoralyn HellingDCS Referring Phys: [id number] Kojo Liby  IMPRESSIONS   1. Left ventricular ejection  fraction, by estimation, is 60 to 65%. The left ventricle has normal function. The left ventricle has no regional wall motion abnormalities. Left ventricular diastolic parameters were normal. 2. Right ventricular systolic function is normal. The right ventricular size is normal. 3. The mitral valve is abnormal. Mild mitral valve regurgitation. No evidence of mitral stenosis. Moderate mitral annular calcification. 4. The aortic valve is tricuspid. There is moderate calcification of the aortic valve. There is moderate thickening of the aortic valve. Aortic valve regurgitation is not visualized. Mild aortic valve stenosis. 5. The inferior vena cava is normal in size with greater than 50% respiratory variability,  suggesting right atrial pressure of 3 mmHg.  FINDINGS Left Ventricle: Left ventricular ejection fraction, by estimation, is 60 to 65%. The left ventricle has normal function. The left ventricle has no regional wall motion abnormalities. The left ventricular internal cavity size was normal in size. There is no left ventricular hypertrophy. Left ventricular diastolic parameters were normal.  Right Ventricle: The right ventricular size is normal. No increase in right ventricular wall thickness. Right ventricular systolic function is normal.  Left Atrium: Left atrial size was normal in size.  Right Atrium: Right atrial size was normal in size.  Pericardium: There is no evidence of pericardial effusion.  Mitral Valve: The mitral valve is abnormal. There is mild thickening of the mitral valve leaflet(s). There is mild calcification of the mitral valve leaflet(s). Moderate mitral annular calcification. Mild mitral valve regurgitation. No evidence of mitral valve stenosis.  Tricuspid Valve: The tricuspid valve is normal in structure. Tricuspid valve regurgitation is mild . No evidence of tricuspid stenosis.  Aortic Valve: The aortic valve is tricuspid. There is moderate calcification of the  aortic valve. There is moderate thickening of the aortic valve. Aortic valve regurgitation is not visualized. Aortic regurgitation PHT measures 462 msec. Mild aortic stenosis is present. Aortic valve mean gradient measures 9.5 mmHg. Aortic valve peak gradient measures 19.3 mmHg. Aortic valve area, by VTI measures 1.53 cm.  Pulmonic Valve: The pulmonic valve was normal in structure. Pulmonic valve regurgitation is not visualized. No evidence of pulmonic stenosis.  Aorta: The aortic root is normal in size and structure.  Venous: The inferior vena cava is normal in size with greater than 50% respiratory variability, suggesting right atrial pressure of 3 mmHg.  IAS/Shunts: No atrial level shunt detected by color flow Doppler.   LEFT VENTRICLE PLAX 2D LVIDd:         4.30 cm   Diastology LVIDs:         2.30 cm   LV e' medial:    9.25 cm/s LV PW:         1.20 cm   LV E/e' medial:  15.1 LV IVS:        0.90 cm   LV e' lateral:   11.20 cm/s LVOT diam:     1.70 cm   LV E/e' lateral: 12.5 LV SV:         81 LV SV Index:   43 LVOT Area:     2.27 cm   RIGHT VENTRICLE             IVC RV S prime:     14.10 cm/s  IVC diam: 1.00 cm TAPSE (M-mode): 2.2 cm RVSP:           34.6 mmHg  LEFT ATRIUM             Index        RIGHT ATRIUM           Index LA diam:        3.70 cm 1.97 cm/m   RA Pressure: 3.00 mmHg LA Vol (A2C):   39.6 ml 21.12 ml/m  RA Area:     11.20 cm LA Vol (A4C):   50.4 ml 26.89 ml/m  RA Volume:   27.00 ml  14.40 ml/m LA Biplane Vol: 48.0 ml 25.61 ml/m AORTIC VALVE AV Area (Vmax):    1.45 cm AV Area (Vmean):   1.54 cm AV Area (VTI):     1.53 cm AV Vmax:  219.50 cm/s AV Vmean:          146.500 cm/s AV VTI:            0.530 m AV Peak Grad:      19.3 mmHg AV Mean Grad:      9.5 mmHg LVOT Vmax:         140.00 cm/s LVOT Vmean:        99.300 cm/s LVOT VTI:          0.358 m LVOT/AV VTI ratio: 0.68 AI PHT:            462 msec  AORTA Ao Root diam: 3.30 cm Ao Asc  diam:  3.60 cm  MITRAL VALVE                TRICUSPID VALVE MV Area (PHT): 3.19 cm     TR Peak grad:   31.6 mmHg MV Decel Time: 238 msec     TR Vmax:        281.00 cm/s MV E velocity: 140.00 cm/s  Estimated RAP:  3.00 mmHg MV A velocity: 166.00 cm/s  RVSP:           34.6 mmHg MV E/A ratio:  0.84 SHUNTS Systemic VTI:  0.36 m Systemic Diam: 1.70 cm  Jenkins Rouge MD Electronically signed by Jenkins Rouge MD Signature Date/Time: 07/13/2022/2:11:29 PM    Final              Recent Labs: No results found for requested labs within last 365 days.  Recent Lipid Panel No results found for: "CHOL", "TRIG", "HDL", "CHOLHDL", "VLDL", "LDLCALC", "LDLDIRECT"   Physical Exam:    VS:  BP 130/80   Pulse 65   Ht 5' (1.524 m)   Wt 210 lb (95.3 kg)   SpO2 96%   BMI 41.01 kg/m     Wt Readings from Last 3 Encounters:  09/30/22 210 lb (95.3 kg)  06/12/22 205 lb (93 kg)    GEN: No distress morbid obesity HEENT: Normal NECK: No JVD CARDIAC: RRR, systolic murmur, no rubs, gallops RESPIRATORY:  Clear to auscultation without rales, wheezing or rhonchi  ABDOMEN: Soft, non-tender, non-distended MUSCULOSKELETAL:  No edema; No deformity  SKIN: Warm and dry NEUROLOGIC:  Alert and oriented x 3 PSYCHIATRIC:  Normal affect   ASSESSMENT:    1. Mixed hyperlipidemia   2. Morbid obesity ([place])   3. Mitral annular calcification     PLAN:    HLD Morbid obesity - she is not amenable to starting medication at this time - we have discussed a return to walking program at the [facility] - we created a sample meal plan using the Mediterranean diet, 1800 calorie goal, and her dislike of chickpeas; it has been sent to her via MyChart - lipids and Lp(a) in 6 months  Systolic murmur - has trivial aortic sclerosis and MAC  - reviewed PET-CT and test results with patient  LVH Hypertension - Controlled on current therapy  Hx of vasovagal syncope - no sx, monitor   Time Spent Directly with Patient:    I have spent a total of 40 minutes with the patient reviewing notes, imaging, EKGs, labs and examining the patient as well as establishing an assessment and plan that was discussed personally with the patient.  > 50% of time was spent in direct patient care andand reviewing imaging with patient.   One year with me    Medication Adjustments/Labs and Tests Ordered: Current medicines are reviewed at length  with the patient today.  Concerns regarding medicines are outlined above.  Orders Placed This Encounter  Procedures   Lipid panel   Lipoprotein A (LPA)   No orders of the defined types were placed in this encounter.   Patient Instructions  Medication Instructions:  Your physician recommends that you continue on your current medications as directed. Please refer to the Current Medication list given to you today.  *If you need a refill on your cardiac medications before your next appointment, please call your pharmacy*   Lab Work: Lipids, Lpa  60mhs If you have labs (blood work) drawn today and your tests are completely normal, you will receive your results only by: [place](if you have MyChart) OR A paper copy in the mail If you have any lab test that is abnormal or we need to change your treatment, we will call you to review the results.    Follow-Up: [facility] our [facility].  As part of our continuing mission to provide you with exceptional heart care, we have created designated Provider Care Teams.  These Care Teams include your primary Cardiologist (physician) and Advanced Practice Providers (APPs -  Physician Assistants and Nurse Practitioners) who all work together to provide you with the care you need, when you need it.  We recommend signing up for the patient portal called "MyChart".  Sign up information is provided on this After Visit Summary.  MyChart is used to connect with patients for Virtual Visits (Telemedicine).   Patients are able to view lab/test results, encounter notes, upcoming appointments, etc.  Non-urgent messages can be sent to your provider as well.   To learn more about what you can do with MyChart, go to [email or web address]    Your next appointment:   1 year(s)  Provider:   MWerner Lean MD       Signed, MWerner Lean MD  09/30/2022 10:20 AM    [place]

## 2022-09-30 NOTE — Patient Instructions (Signed)
Medication Instructions:  Your physician recommends that you continue on your current medications as directed. Please refer to the Current Medication list given to you today.  *If you need a refill on your cardiac medications before your next appointment, please call your pharmacy*   Lab Work: Lipids, Lpa  49mhs If you have labs (blood work) drawn today and your tests are completely normal, you will receive your results only by: [place](if you have MyChart) OR A paper copy in the mail If you have any lab test that is abnormal or we need to change your treatment, we will call you to review the results.    Follow-Up: [facility] our [facility].  As part of our continuing mission to provide you with exceptional heart care, we have created designated Provider Care Teams.  These Care Teams include your primary Cardiologist (physician) and Advanced Practice Providers (APPs -  Physician Assistants and Nurse Practitioners) who all work together to provide you with the care you need, when you need it.  We recommend signing up for the patient portal called "MyChart".  Sign up information is provided on this After Visit Summary.  MyChart is used to connect with patients for Virtual Visits (Telemedicine).  Patients are able to view lab/test results, encounter notes, upcoming appointments, etc.  Non-urgent messages can be sent to your provider as well.   To learn more about what you can do with MyChart, go to [email or web address]    Your next appointment:   1 year(s)  Provider:   MWerner Lean MD

## 2023-03-08 ENCOUNTER — Ambulatory Visit: Payer: Medicare Other | Attending: Internal Medicine

## 2023-03-08 DIAGNOSIS — E782 Mixed hyperlipidemia: Secondary | ICD-10-CM

## 2023-06-08 ENCOUNTER — Other Ambulatory Visit: Payer: Self-pay | Admitting: Gynecology

## 2023-06-08 DIAGNOSIS — Z1231 Encounter for screening mammogram for malignant neoplasm of breast: Secondary | ICD-10-CM

## 2023-07-14 ENCOUNTER — Ambulatory Visit
Admission: RE | Admit: 2023-07-14 | Discharge: 2023-07-14 | Disposition: A | Discharge status: Home / Self Care | Payer: Medicare Other | Source: Ambulatory Visit | Attending: Gynecology | Admitting: Gynecology

## 2023-07-14 DIAGNOSIS — Z1231 Encounter for screening mammogram for malignant neoplasm of breast: Secondary | ICD-10-CM

## 2024-07-15 ENCOUNTER — Other Ambulatory Visit (HOSPITAL_BASED_OUTPATIENT_CLINIC_OR_DEPARTMENT_OTHER): Payer: Self-pay | Admitting: Family Medicine

## 2024-07-15 DIAGNOSIS — M810 Age-related osteoporosis without current pathological fracture: Secondary | ICD-10-CM
